# Patient Record
Sex: Female | Born: 2000 | Race: White | Hispanic: No | Marital: Single | Smoking: Never smoker
Health system: Southern US, Community
[De-identification: ages and names within clinical notes are randomized; demographics above are authoritative.]

## PROBLEM LIST (undated history)

## (undated) DIAGNOSIS — F32A Depression, unspecified: Secondary | ICD-10-CM

## (undated) DIAGNOSIS — F909 Attention-deficit hyperactivity disorder, unspecified type: Secondary | ICD-10-CM

## (undated) DIAGNOSIS — F329 Major depressive disorder, single episode, unspecified: Secondary | ICD-10-CM

## (undated) DIAGNOSIS — F419 Anxiety disorder, unspecified: Secondary | ICD-10-CM

## (undated) HISTORY — DX: Anxiety disorder, unspecified: F41.9

## (undated) HISTORY — DX: Major depressive disorder, single episode, unspecified: F32.9

## (undated) HISTORY — DX: Attention-deficit hyperactivity disorder, unspecified type: F90.9

## (undated) HISTORY — DX: Depression, unspecified: F32.A

---
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
## 2021-08-10 ENCOUNTER — Other Ambulatory Visit (HOSPITAL_BASED_OUTPATIENT_CLINIC_OR_DEPARTMENT_OTHER): Payer: Self-pay

## 2021-08-10 MED ORDER — MECLIZINE HCL 25 MG PO TABS: ORAL_TABLET | ORAL | 0 refills | Status: AC
